# Patient Record
Sex: Female | Born: 1995 | Race: Black or African American | Hispanic: No
Health system: Southern US, Community
[De-identification: ages and names within clinical notes are randomized; demographics above are authoritative.]

## PROBLEM LIST (undated history)

## (undated) DIAGNOSIS — M67439 Ganglion, unspecified wrist: Secondary | ICD-10-CM

## (undated) DIAGNOSIS — Z9109 Other allergy status, other than to drugs and biological substances: Secondary | ICD-10-CM

---
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
## 2018-04-15 IMAGING — CT CT ABD-PELV W/ CM
2 of 4 series · 16 of 46 positions shown, 18 images · IV contrast (APPLIED)
Comparison: None.

CLINICAL DATA: Right lower quadrant pain with onset of symptoms
this morning.

EXAM:
CT ABDOMEN AND PELVIS WITH CONTRAST
TECHNIQUE: Multidetector CT imaging of the abdomen and pelvis was performed
using the standard protocol following bolus administration of
intravenous contrast.
CONTRAST:  100mL LISCGC-B11 IOPAMIDOL (LISCGC-B11) INJECTION 61%

[Series 2: axial st · axial · 0.83mm/px · z∈[-514,-78]mm · 13 of 95 slices shown, 15 images]
[im 4/95  soft-tissue]
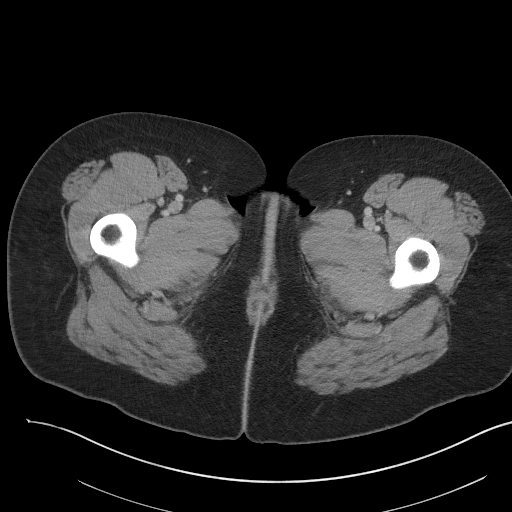
[im 4/95  bone]
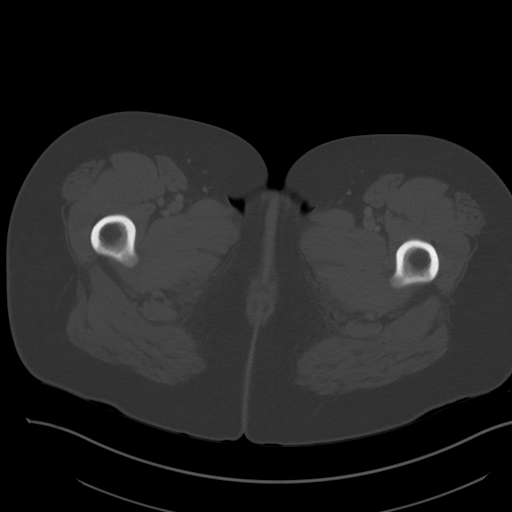
[im 12/95  soft-tissue]
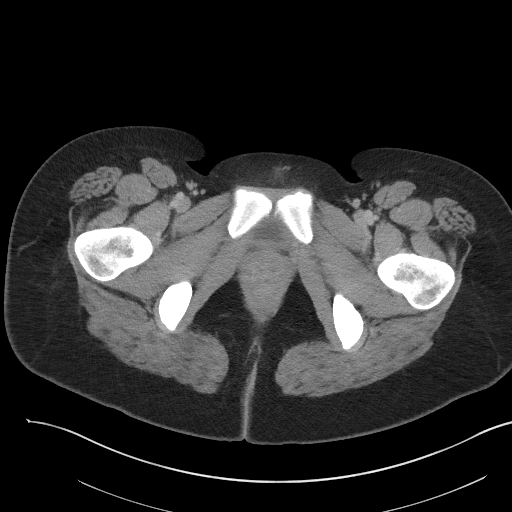
[im 19/95  soft-tissue]
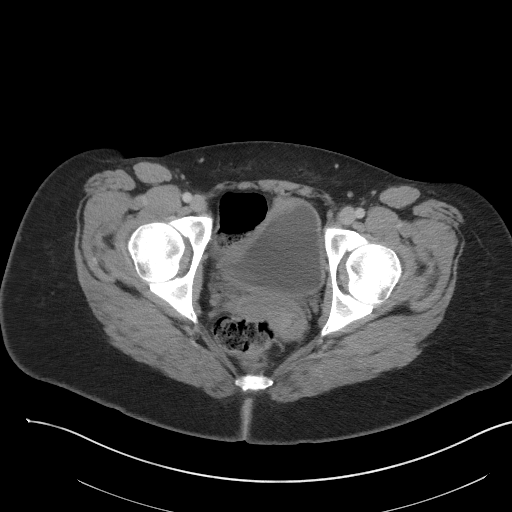
[im 27/95  soft-tissue]
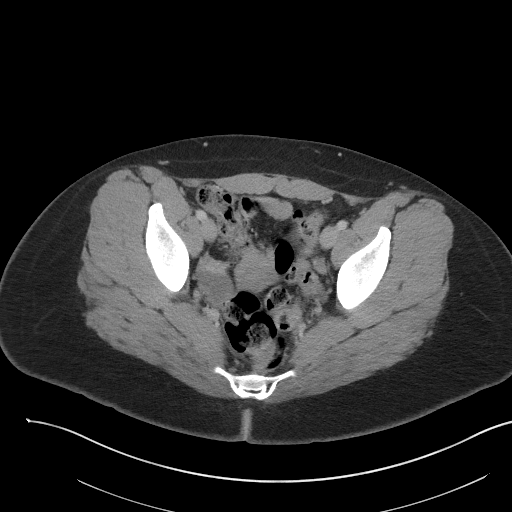
[im 34/95  soft-tissue]
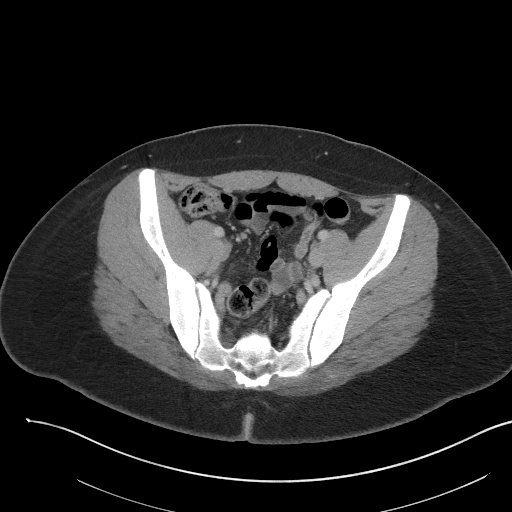
[im 42/95  soft-tissue]
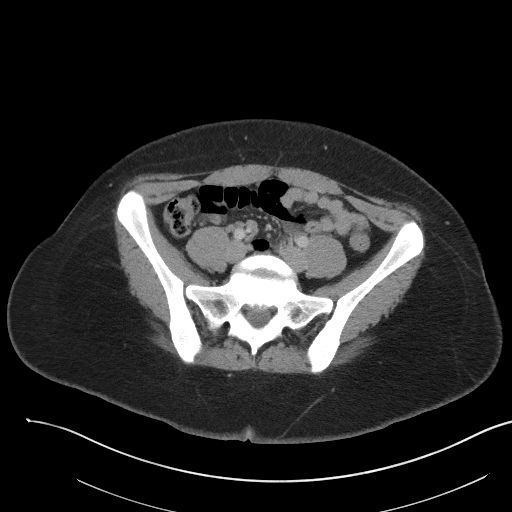
[im 49/95  soft-tissue]
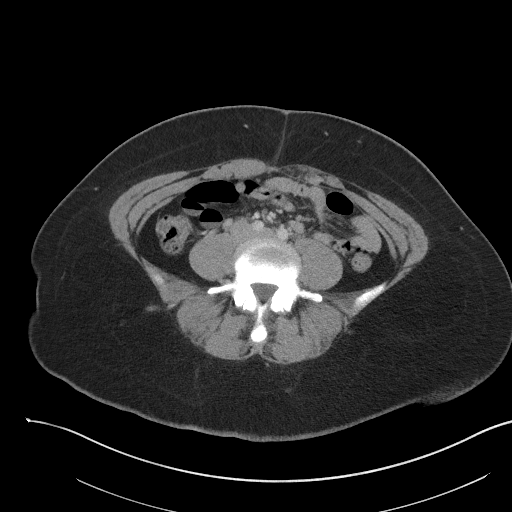
[im 53/95  soft-tissue]
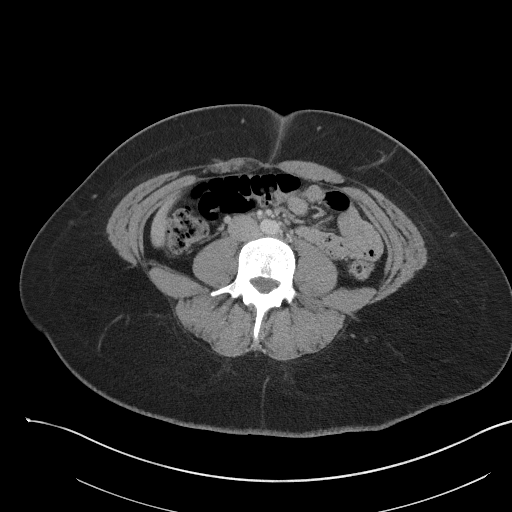
[im 61/95  soft-tissue]
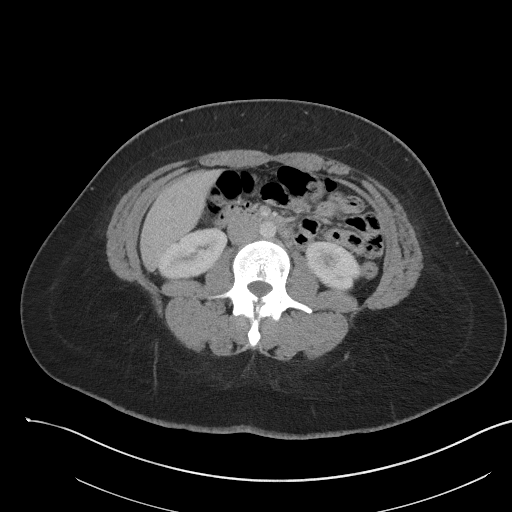
[im 61/95  bone]
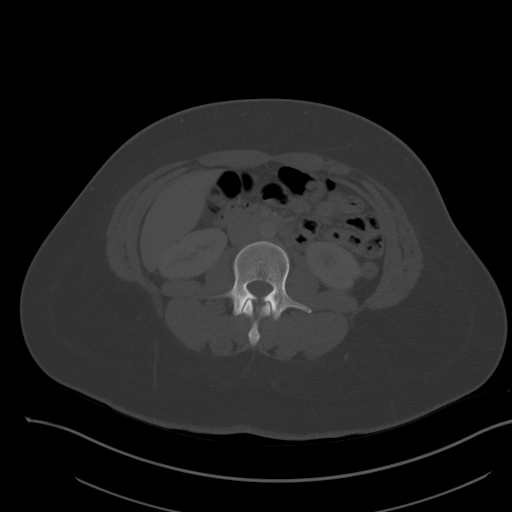
[im 68/95  soft-tissue]
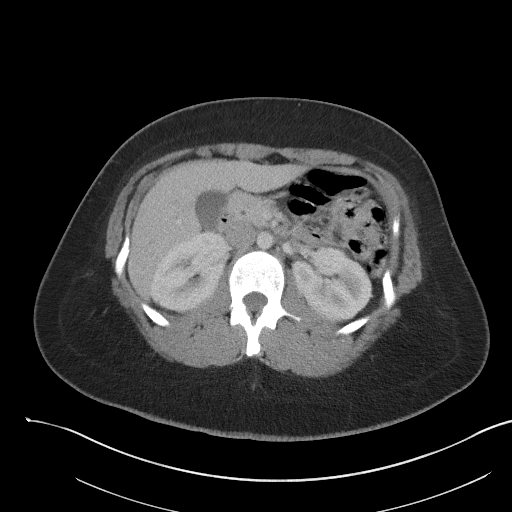
[im 76/95  soft-tissue]
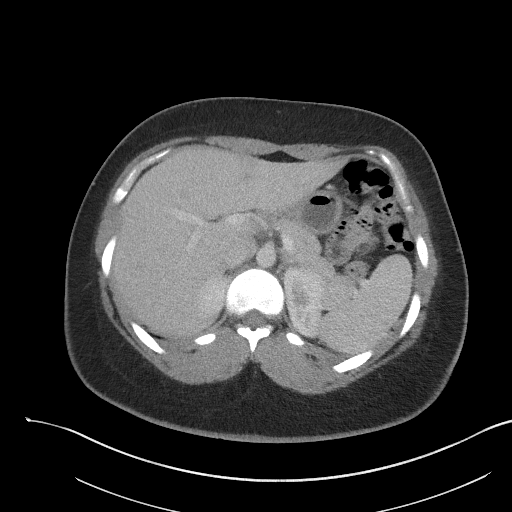
[im 83/95  soft-tissue]
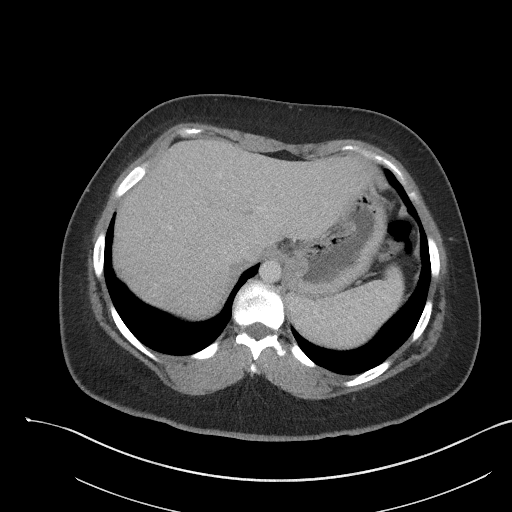
[im 91/95  soft-tissue]
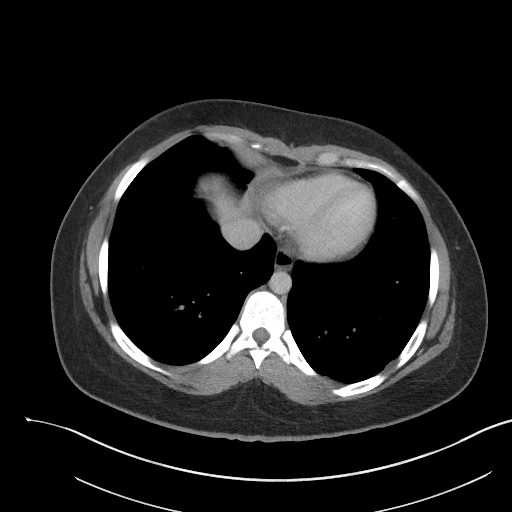

[Series 5: coronal st · coronal · 0.71mm/px · 3 of 89 slices shown]
[im 30/89  soft-tissue]
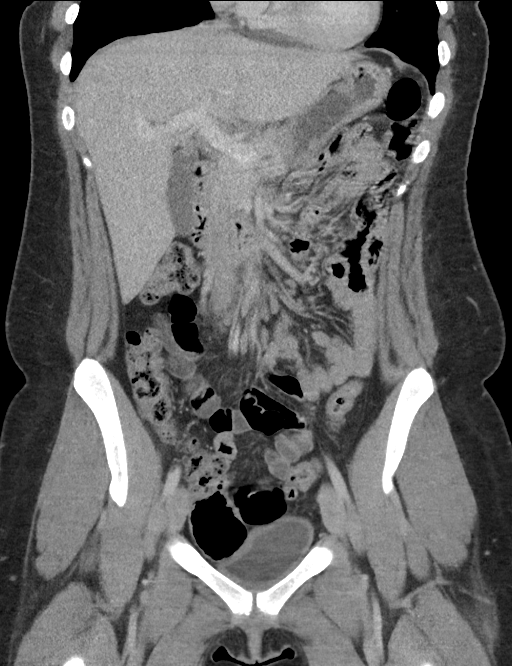
[im 40/89  soft-tissue]
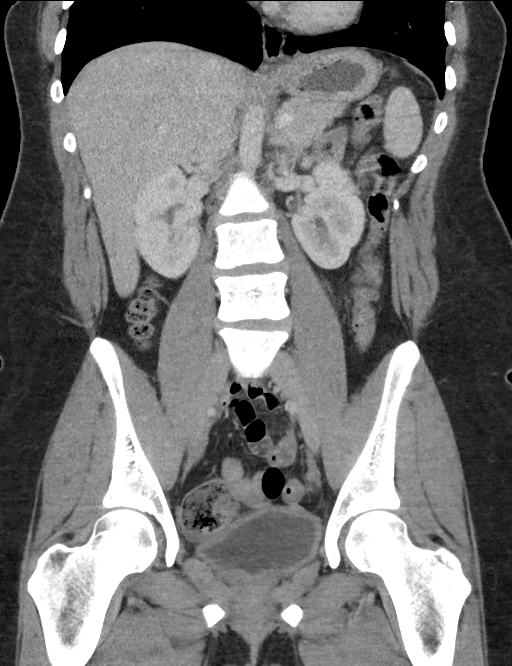
[im 49/89  soft-tissue]
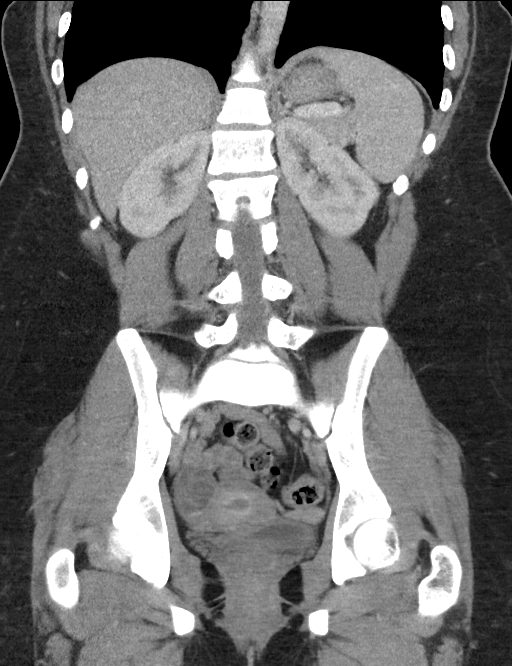

[16 of 46 positions shown; findings below may reference images not displayed]

FINDINGS: Lower chest: Negative.

Hepatobiliary: Liver and gallbladder are unremarkable. No biliary
ductal dilatation.

Pancreas: Negative.

Spleen: Negative.

Adrenals/Urinary Tract: Adrenal glands and kidneys are unremarkable.
Ureters are decompressed. Bladder is unremarkable.

Stomach/Bowel: Stomach, small bowel, appendix (coronal image 44) and
colon are unremarkable.

Vascular/Lymphatic: Vascular structures are unremarkable. No
pathologically enlarged lymph nodes.

Reproductive: Uterus is visualized.  No adnexal mass.

Other: No free fluid.  Mesenteries and peritoneum are unremarkable.

Musculoskeletal: No worrisome lytic or sclerotic lesions.
IMPRESSION: No findings to explain the patient's pain.  Normal exam.

## 2019-06-22 ENCOUNTER — Ambulatory Visit: Payer: Self-pay

## 2021-01-04 ENCOUNTER — Ambulatory Visit (HOSPITAL_COMMUNITY): Admission: EM | Admit: 2021-01-04 | Payer: Medicaid Other | Source: Home / Self Care

## 2021-01-04 ENCOUNTER — Other Ambulatory Visit: Payer: Self-pay

## 2021-07-12 ENCOUNTER — Ambulatory Visit
Admission: RE | Admit: 2021-07-12 | Discharge: 2021-07-12 | Disposition: A | Discharge status: Home / Self Care | Payer: Medicaid Other | Source: Ambulatory Visit | Attending: Internal Medicine | Admitting: Internal Medicine

## 2021-07-12 VITALS — BP 114/81 | HR 80 | Temp 98.3°F | Resp 18 | Ht 71.0 in | Wt 270.0 lb

## 2021-07-12 DIAGNOSIS — N910 Primary amenorrhea: Secondary | ICD-10-CM

## 2021-07-12 LAB — CBC WITH DIFFERENTIAL/PLATELET
Abs Immature Granulocytes: 0.05 10*3/uL (ref 0.00–0.07)
Basophils Absolute: 0.1 10*3/uL (ref 0.0–0.1)
Basophils Relative: 0 %
Eosinophils Absolute: 0.4 10*3/uL (ref 0.0–0.5)
Eosinophils Relative: 4 %
HCT: 37.5 % (ref 36.0–46.0)
Hemoglobin: 12 g/dL (ref 12.0–15.0)
Immature Granulocytes: 0 %
Lymphocytes Relative: 30 %
Lymphs Abs: 3.4 10*3/uL (ref 0.7–4.0)
MCH: 27.7 pg (ref 26.0–34.0)
MCHC: 32 g/dL (ref 30.0–36.0)
MCV: 86.6 fL (ref 80.0–100.0)
Monocytes Absolute: 1 10*3/uL (ref 0.1–1.0)
Monocytes Relative: 9 %
Neutro Abs: 6.4 10*3/uL (ref 1.7–7.7)
Neutrophils Relative %: 57 %
Platelets: 308 10*3/uL (ref 150–400)
RBC: 4.33 MIL/uL (ref 3.87–5.11)
RDW: 13.6 % (ref 11.5–15.5)
WBC: 11.3 10*3/uL — ABNORMAL HIGH (ref 4.0–10.5)
nRBC: 0 % (ref 0.0–0.2)

## 2021-07-12 LAB — COMPREHENSIVE METABOLIC PANEL
ALT: 20 U/L (ref 0–44)
AST: 23 U/L (ref 15–41)
Albumin: 3.6 g/dL (ref 3.5–5.0)
Alkaline Phosphatase: 65 U/L (ref 38–126)
Anion gap: 6 (ref 5–15)
BUN: 11 mg/dL (ref 6–20)
CO2: 25 mmol/L (ref 22–32)
Calcium: 8.7 mg/dL — ABNORMAL LOW (ref 8.9–10.3)
Chloride: 103 mmol/L (ref 98–111)
Creatinine, Ser: 0.72 mg/dL (ref 0.44–1.00)
GFR, Estimated: >60 mL/min (ref >60)
Glucose, Bld: 63 mg/dL — ABNORMAL LOW (ref 70–99)
Potassium: 3.7 mmol/L (ref 3.5–5.1)
Sodium: 134 mmol/L — ABNORMAL LOW (ref 135–145)
Total Bilirubin: 0.4 mg/dL (ref 0.3–1.2)
Total Protein: 7.7 g/dL (ref 6.5–8.1)

## 2021-07-12 LAB — URINALYSIS, ROUTINE W REFLEX MICROSCOPIC
Bilirubin Urine: NEGATIVE
Glucose, UA: NEGATIVE mg/dL
Hgb urine dipstick: NEGATIVE
Ketones, ur: NEGATIVE mg/dL
Leukocytes,Ua: NEGATIVE
Nitrite: NEGATIVE
Protein, ur: NEGATIVE mg/dL
Specific Gravity, Urine: 1.02 (ref 1.005–1.030)
pH: 6.5 (ref 5.0–8.0)

## 2021-07-12 LAB — PREGNANCY, URINE: Preg Test, Ur: NEGATIVE

## 2021-07-12 LAB — TSH: TSH: 2.191 u[IU]/mL (ref 0.350–4.500)

## 2021-07-12 NOTE — ED Notes (Signed)
Provided work note

## 2021-07-12 NOTE — ED Triage Notes (Signed)
Patient is here for "Abd Pain". "Missing menses, [id number] was last, family history of cervical problems, benign tumors, cysts on ovaries, Gyn has no bookings for mos". Recently started "allergy medication'.  ?

## 2021-07-12 NOTE — Discharge Instructions (Addendum)
Establish follow-up with primary care physician for further management. ?We will call you with treatment if labs abnormal ?

## 2021-07-14 NOTE — ED Provider Notes (Signed)
?[facility] ? ? ? ?CSN: [id number] ?Arrival date & time: 07/12/21  1357 ? ? ?  ? ?History   ?Chief Complaint ?Chief Complaint  ?Patient presents with  ? Abdominal Pain  ?  Obgyn ? ?Last period was 1 month & 15 days ago. ?Need to be checked, I have a family history of cervical problems. - Entered by patient ?Appt.  ? ? ?HPI ?Kennede Prosen is a 26 y.o. female comes to urgent care with complaints of abdominal pain.  She denies any morning sickness, persistent nausea or vomiting.  No bloating.  No abdominal distention.  No fever or chills.  No dysuria urgency or frequency.  She is also complaining of having some fatigue.  No dizziness, near syncope or syncopal episodes.  Last menstrual period was 05/28/2021.  No cold intolerance.  No significant weight changes recently. ?HPI ? ?Past Medical History:  ?Diagnosis Date  ? Environmental allergies   ? Ganglion cyst of wrist 11/2012  ? left  ? ? ?There are no problems to display for this patient. ? ? ?Past Surgical History:  ?Procedure Laterality Date  ? GANGLION CYST EXCISION Left 12/11/2012  ? Procedure: REMOVAL GANGLION OF LEFT WRIST;  Surgeon: Jerilynn Mages. Gerald Stabs, MD;  Location: [facility];  Service: Pediatrics;  Laterality: Left;  ? ? ?OB History   ?No obstetric history on file. ?  ? ? ? ?Home Medications   ? ?Prior to Admission medications   ?Medication Sig Start Date End Date Taking? Authorizing Provider  ?albuterol (VENTOLIN HFA) 108 (90 Base) MCG/ACT inhaler INHALE 2 PUFFS BY MOUTH EVERY 4 HOURS AS NEEDED FOR COUGH OR WHEEZE 05/27/15  Yes [provider]  ?cetirizine (ZYRTEC) 10 MG tablet Take by mouth. 05/26/15  Yes [provider]  ?chlorpheniramine-HYDROcodone 10-8 MG/5ML Take by mouth. 01/04/21  Yes [provider]  ?doxycycline (VIBRAMYCIN) 100 MG capsule Take by mouth. 07/08/21 07/15/21 Yes [provider]  ?meloxicam (MOBIC) 15 MG tablet Take by mouth. 01/04/21 01/04/22 Yes [provider]   ?chlorhexidine (PERIDEX) 0.12 % solution SMARTSIG:By Mouth 05/22/21   [provider]  ?HYDROcodone-acetaminophen (NORCO/VICODIN) 5-325 MG per tablet Take 1-2 tablets by mouth every 6 (six) hours as needed for pain. 12/11/12   Gerald Stabs, MD  ?levocetirizine (XYZAL) 5 MG tablet Take 5 mg by mouth every evening.    [provider]  ?montelukast (SINGULAIR) 10 MG tablet SMARTSIG:1 Tablet(s) By Mouth Every Evening 06/21/21   [provider]  ?ondansetron (ZOFRAN ODT) 4 MG disintegrating tablet Take 1 tablet (4 mg total) by mouth every 8 (eight) hours as needed for nausea or vomiting. 07/04/16   Darel Hong, MD  ?sulfamethoxazole-trimethoprim (BACTRIM DS,SEPTRA DS) 800-160 MG tablet Take 1 tablet by mouth 2 (two) times daily. 06/30/15   Sable Feil, PA-C  ? ? ?Family History ?Family History  ?Problem Relation Age of Onset  ? Hypertension Maternal Grandmother   ? Hypertension Maternal Grandfather   ? Diabetes Paternal Grandmother   ? ? ?Social History ?Social History  ? ?Tobacco Use  ? Smoking status: Never  ? Smokeless tobacco: Never  ?Vaping Use  ? Vaping Use: Never used  ?Substance Use Topics  ? Alcohol use: No  ? Drug use: No  ? ? ? ?Allergies   ?Almond (diagnostic), Almond oil, and Amoxicillin ? ? ?Review of Systems ?Review of Systems ?As per HPI ? ?Physical Exam ?Triage Vital Signs ?ED Triage Vitals  ?Enc Vitals Group  ?   BP 07/12/21  1418 114/81  ?   Pulse Rate 07/12/21 1418 80  ?   Resp 07/12/21 1418 18  ?   Temp 07/12/21 1418 98.3 ?F (36.8 ?C)  ?   Temp Source 07/12/21 1418 Oral  ?   SpO2 07/12/21 1418 99 %  ?   Weight 07/12/21 1415 270 lb (122.5 kg)  ?   Height 07/12/21 1415 5\' 11"  (1.803 m)  ?   Head Circumference --   ?   Peak Flow --   ?   Pain Score 07/12/21 1411 2  ?   Pain Loc --   ?   Pain Edu? --   ?   Excl. in [place]? --   ? ?No data found. ? ?Updated Vital Signs ?BP 114/81 (BP Location: Left Arm)   Pulse 80   Temp 98.3 ?F (36.8 ?C) (Oral)   Resp 18   Ht 5\' 11"  (1.803  m)   Wt 122.5 kg   LMP 05/28/2021 (Exact Date)   SpO2 99%   BMI 37.66 kg/m?  ? ?Visual Acuity ?Right Eye Distance:   ?Left Eye Distance:   ?Bilateral Distance:   ? ?Right Eye Near:   ?Left Eye Near:    ?Bilateral Near:    ? ?Physical Exam ?Vitals and nursing note reviewed.  ?Constitutional:   ?   General: She is not in acute distress. ?   Appearance: She is not ill-appearing.  ?Cardiovascular:  ?   Rate and Rhythm: Normal rate and regular rhythm.  ?Pulmonary:  ?   Effort: Pulmonary effort is normal.  ?   Breath sounds: Normal breath sounds.  ?Abdominal:  ?   General: Bowel sounds are normal.  ?   Palpations: Abdomen is soft. There is no shifting dullness, hepatomegaly or splenomegaly.  ?   Tenderness: There is no abdominal tenderness.  ?Neurological:  ?   Mental Status: She is alert.  ? ? ? ?UC Treatments / Results  ?Labs ?(all labs ordered are listed, but only abnormal results are displayed) ?Labs Reviewed  ?CBC WITH DIFFERENTIAL/PLATELET - Abnormal; Notable for the following components:  ?    Result Value  ? WBC 11.3 (*)   ? All other components within normal limits  ?COMPREHENSIVE METABOLIC PANEL - Abnormal; Notable for the following components:  ? Sodium 134 (*)   ? Glucose, Bld 63 (*)   ? Calcium 8.7 (*)   ? All other components within normal limits  ?PREGNANCY, URINE  ?URINALYSIS, ROUTINE W REFLEX MICROSCOPIC  ?TSH  ? ? ?EKG ? ? ?Radiology ?No results found. ? ?Procedures ?Procedures (including critical care time) ? ?Medications Ordered in UC ?Medications - No data to display ? ?Initial Impression / Assessment and Plan / UC Course  ?I have reviewed the triage vital signs and the nursing notes. ? ?Pertinent labs & imaging results that were available during my care of the patient were reviewed by me and considered in my medical decision making (see chart for details). ? ?  ? ?1.  Delayed menses: ?TSH, ?Urine pregnancy test is negative ?CBC, CMP ?We will call patient with recommendations if labs are  abnormal ?Return to urgent care for any other concerns. ?Final Clinical Impressions(s) / UC Diagnoses  ? ?Final diagnoses:  ?Delayed menses  ? ? ? ?Discharge Instructions   ? ?  ?Establish follow-up with primary care physician for further management. ?We will call you with treatment if labs abnormal ? ? ? ?ED Prescriptions   ?None ?  ? ?PDMP not  reviewed this encounter. ?  ?Chase Picket, MD ?07/14/21 1845 ? ?

## 2021-07-18 ENCOUNTER — Ambulatory Visit
Admission: RE | Admit: 2021-07-18 | Discharge: 2021-07-18 | Disposition: A | Discharge status: Home / Self Care | Payer: Medicaid Other | Source: Ambulatory Visit

## 2021-07-18 VITALS — BP 120/79 | HR 93 | Temp 97.9°F | Resp 18

## 2021-07-18 DIAGNOSIS — M79621 Pain in right upper arm: Secondary | ICD-10-CM

## 2021-07-18 DIAGNOSIS — R52 Pain, unspecified: Secondary | ICD-10-CM

## 2021-07-18 NOTE — ED Provider Notes (Signed)
?[facility] ? ? ? ?CSN: [id number] ?Arrival date & time: 07/18/21  1727 ? ? ?  ? ?History   ?Chief Complaint ?Chief Complaint  ?Patient presents with  ? Generalized Body Aches  ? Arm Pain  ? ? ?HPI ?Kristin Fuller is a 26 y.o. female.  Patient presents with a painful knot in her right upper arm yesterday which has resolved today.  She also has generalized body aches x3 days.  She denies numbness, weakness, fever, chills, chest pain, shortness of breath, or other symptoms.  When patient had the knot in her arm yesterday, she did not see any redness or wounds; she did not have any increased warmth.  No injuries.  She was seen by her gynecologist on 07/13/2021 and started on medroxyprogesterone.  She is concerned that this medication may be causing her symptoms and that she may be having a reaction to the medication.   ?The history is provided by the patient and medical records.  ? ?Past Medical History:  ?Diagnosis Date  ? Environmental allergies   ? Ganglion cyst of wrist 11/2012  ? left  ? ? ?There are no problems to display for this patient. ? ? ?Past Surgical History:  ?Procedure Laterality Date  ? GANGLION CYST EXCISION Left 12/11/2012  ? Procedure: REMOVAL GANGLION OF LEFT WRIST;  Surgeon: Judie PetitM. Leonia CoronaShuaib Farooqui, MD;  Location: [facility];  Service: Pediatrics;  Laterality: Left;  ? ? ?OB History   ?No obstetric history on file. ?  ? ? ? ?Home Medications   ? ?Prior to Admission medications   ?Medication Sig Start Date End Date Taking? Authorizing Provider  ?albuterol (VENTOLIN HFA) 108 (90 Base) MCG/ACT inhaler INHALE 2 PUFFS BY MOUTH EVERY 4 HOURS AS NEEDED FOR COUGH OR WHEEZE 05/27/15   [provider]  ?cetirizine (ZYRTEC) 10 MG tablet Take by mouth. 05/26/15   [provider]  ?chlorhexidine (PERIDEX) 0.12 % solution SMARTSIG:By Mouth 05/22/21   [provider]  ?chlorpheniramine-HYDROcodone 10-8 MG/5ML Take by mouth. 01/04/21   [provider]   ?HYDROcodone-acetaminophen (NORCO/VICODIN) 5-325 MG per tablet Take 1-2 tablets by mouth every 6 (six) hours as needed for pain. 12/11/12   Leonia CoronaFarooqui, Shuaib, MD  ?levocetirizine (XYZAL) 5 MG tablet Take 5 mg by mouth every evening.    [provider]  ?meloxicam (MOBIC) 15 MG tablet Take by mouth. 01/04/21 01/04/22  [provider]  ?montelukast (SINGULAIR) 10 MG tablet SMARTSIG:1 Tablet(s) By Mouth Every Evening 06/21/21   [provider]  ?ondansetron (ZOFRAN ODT) 4 MG disintegrating tablet Take 1 tablet (4 mg total) by mouth every 8 (eight) hours as needed for nausea or vomiting. 07/04/16   Merrily Brittleifenbark, Neil, MD  ?sulfamethoxazole-trimethoprim (BACTRIM DS,SEPTRA DS) 800-160 MG tablet Take 1 tablet by mouth 2 (two) times daily. 06/30/15   Joni ReiningSmith, Ronald K, PA-C  ? ? ?Family History ?Family History  ?Problem Relation Age of Onset  ? Hypertension Maternal Grandmother   ? Hypertension Maternal Grandfather   ? Diabetes Paternal Grandmother   ? ? ?Social History ?Social History  ? ?Tobacco Use  ? Smoking status: Never  ? Smokeless tobacco: Never  ?Vaping Use  ? Vaping Use: Never used  ?Substance Use Topics  ? Alcohol use: No  ? Drug use: No  ? ? ? ?Allergies   ?Almond (diagnostic), Almond oil, and Amoxicillin ? ? ?Review of Systems ?Review of Systems  ?Constitutional:  Negative for chills and fever.  ?Respiratory:  Negative for cough and shortness  of breath.   ?Cardiovascular:  Negative for chest pain and palpitations.  ?Musculoskeletal:  Positive for myalgias. Negative for arthralgias.  ?Skin:  Negative for color change, rash and wound.  ?Neurological:  Negative for weakness and numbness.  ?All other systems reviewed and are negative. ? ? ?Physical Exam ?Triage Vital Signs ?ED Triage Vitals  ?Enc Vitals Group  ?   BP 07/18/21 1745 120/79  ?   Pulse Rate 07/18/21 1745 93  ?   Resp 07/18/21 1745 18  ?   Temp 07/18/21 1745 97.9 ?F (36.6 ?C)  ?   Temp src --   ?   SpO2 07/18/21 1745 97 %  ?   Weight --    ?   Height --   ?   Head Circumference --   ?   Peak Flow --   ?   Pain Score 07/18/21 1759 7  ?   Pain Loc --   ?   Pain Edu? --   ?   Excl. in [place]? --   ? ?No data found. ? ?Updated Vital Signs ?BP 120/79   Pulse 93   Temp 97.9 ?F (36.6 ?C)   Resp 18   SpO2 97%  ? ?Visual Acuity ?Right Eye Distance:   ?Left Eye Distance:   ?Bilateral Distance:   ? ?Right Eye Near:   ?Left Eye Near:    ?Bilateral Near:    ? ?Physical Exam ?Vitals and nursing note reviewed.  ?Constitutional:   ?   General: She is not in acute distress. ?   Appearance: She is well-developed. She is obese. She is not ill-appearing.  ?HENT:  ?   Mouth/Throat:  ?   Mouth: Mucous membranes are moist.  ?Cardiovascular:  ?   Rate and Rhythm: Normal rate and regular rhythm.  ?   Heart sounds: Normal heart sounds. No murmur heard. ?Pulmonary:  ?   Effort: Pulmonary effort is normal. No respiratory distress.  ?   Breath sounds: Normal breath sounds.  ?Musculoskeletal:     ?   General: Tenderness present. No swelling or deformity. Normal range of motion.  ?   Cervical back: Neck supple.  ?   Comments: Mild tenderness in posterior right upper arm.  No mass palpated.  No redness, ecchymosis, wounds.  ?Skin: ?   General: Skin is warm and dry.  ?   Capillary Refill: Capillary refill takes less than 2 seconds.  ?   Findings: No bruising, erythema, lesion or rash.  ?Neurological:  ?   General: No focal deficit present.  ?   Mental Status: She is alert and oriented to person, place, and time.  ?   Sensory: No sensory deficit.  ?   Motor: No weakness.  ?Psychiatric:     ?   Mood and Affect: Mood normal.     ?   Behavior: Behavior normal.  ? ? ? ?UC Treatments / Results  ?Labs ?(all labs ordered are listed, but only abnormal results are displayed) ?Labs Reviewed - No data to display ? ?EKG ? ? ?Radiology ?No results found. ? ?Procedures ?Procedures (including critical care time) ? ?Medications Ordered in UC ?Medications - No data to display ? ?Initial Impression /  Assessment and Plan / UC Course  ?I have reviewed the triage vital signs and the nursing notes. ? ?Pertinent labs & imaging results that were available during my care of the patient were reviewed by me and considered in my medical decision making (see chart for  details). ? ?  ?Right upper arm pain, body aches.  Patient is well-appearing and her exam is reassuring.  She is afebrile and vital signs are stable.  No mass palpated in her upper arm.  No indication of infection or injury.  Instructed her to take Tylenol or ibuprofen as needed for discomfort.  ED precautions discussed.  Work note provided per patient request.  Instructed her to follow-up with her gynecologist as scheduled.  She agrees to plan of care. ? ?Final Clinical Impressions(s) / UC Diagnoses  ? ?Final diagnoses:  ?Pain in right upper arm  ?Body aches  ? ? ? ?Discharge Instructions   ? ?  ?Take Tylenol or ibuprofen as needed for discomfort.  Go to the emergency department if you have a painful knot in your upper arm again. ? ? ? ? ?ED Prescriptions   ?None ?  ? ?PDMP not reviewed this encounter. ?  ?Mickie Bail, NP ?07/18/21 1847 ? ?

## 2021-07-18 NOTE — ED Triage Notes (Signed)
Pt here with right arm "knot" and generalized body aches x 3 days since starting a new hormones. Pt would like to make sure she is not experiencing a reaction. She called OBGYN and they stated it is not likely due to the meds.  ?

## 2021-07-18 NOTE — Discharge Instructions (Addendum)
Take Tylenol or ibuprofen as needed for discomfort.  Go to the emergency department if you have a painful knot in your upper arm again. ?

## 2021-07-25 ENCOUNTER — Other Ambulatory Visit: Payer: Self-pay

## 2021-07-25 DIAGNOSIS — Z021 Encounter for pre-employment examination: Secondary | ICD-10-CM

## 2021-07-25 NOTE — Progress Notes (Signed)
Presents to [facility] for pre-employment drug screen. ? ?[facility] Acct #:  [id number] ?[facility] Specimen #:  [id number] ? ?Rapid Drug Screen Results = Negative. ? ?AMD ?

## 2021-09-11 ENCOUNTER — Emergency Department
Admission: EM | Admit: 2021-09-11 | Discharge: 2021-09-11 | Disposition: A | Discharge status: Home / Self Care | Payer: Medicaid Other | Attending: Emergency Medicine | Admitting: Emergency Medicine

## 2021-09-11 ENCOUNTER — Emergency Department: Payer: Medicaid Other

## 2021-09-11 ENCOUNTER — Encounter: Payer: Self-pay | Admitting: Emergency Medicine

## 2021-09-11 DIAGNOSIS — R110 Nausea: Secondary | ICD-10-CM | POA: Insufficient documentation

## 2021-09-11 DIAGNOSIS — R1013 Epigastric pain: Secondary | ICD-10-CM | POA: Diagnosis not present

## 2021-09-11 DIAGNOSIS — R1011 Right upper quadrant pain: Secondary | ICD-10-CM | POA: Diagnosis present

## 2021-09-11 LAB — COMPREHENSIVE METABOLIC PANEL
ALT: 14 U/L (ref 0–44)
AST: 16 U/L (ref 15–41)
Albumin: 3.6 g/dL (ref 3.5–5.0)
Alkaline Phosphatase: 54 U/L (ref 38–126)
Anion gap: 6 (ref 5–15)
BUN: 12 mg/dL (ref 6–20)
CO2: 24 mmol/L (ref 22–32)
Calcium: 8.8 mg/dL — ABNORMAL LOW (ref 8.9–10.3)
Chloride: 106 mmol/L (ref 98–111)
Creatinine, Ser: 0.58 mg/dL (ref 0.44–1.00)
GFR, Estimated: >60 mL/min (ref >60)
Glucose, Bld: 88 mg/dL (ref 70–99)
Potassium: 4 mmol/L (ref 3.5–5.1)
Sodium: 136 mmol/L (ref 135–145)
Total Bilirubin: 0.5 mg/dL (ref 0.3–1.2)
Total Protein: 7.9 g/dL (ref 6.5–8.1)

## 2021-09-11 LAB — LIPASE, BLOOD: Lipase: 24 U/L (ref 11–51)

## 2021-09-11 LAB — CBC
HCT: 41 % (ref 36.0–46.0)
Hemoglobin: 12.9 g/dL (ref 12.0–15.0)
MCH: 27.4 pg (ref 26.0–34.0)
MCHC: 31.5 g/dL (ref 30.0–36.0)
MCV: 87.2 fL (ref 80.0–100.0)
Platelets: 325 10*3/uL (ref 150–400)
RBC: 4.7 MIL/uL (ref 3.87–5.11)
RDW: 12.8 % (ref 11.5–15.5)
WBC: 11.3 10*3/uL — ABNORMAL HIGH (ref 4.0–10.5)
nRBC: 0 % (ref 0.0–0.2)

## 2021-09-11 LAB — URINALYSIS, ROUTINE W REFLEX MICROSCOPIC
Bilirubin Urine: NEGATIVE
Glucose, UA: NEGATIVE mg/dL
Hgb urine dipstick: NEGATIVE
Ketones, ur: NEGATIVE mg/dL
Leukocytes,Ua: NEGATIVE
Nitrite: NEGATIVE
Protein, ur: NEGATIVE mg/dL
Specific Gravity, Urine: 1.017 (ref 1.005–1.030)
pH: 6 (ref 5.0–8.0)

## 2021-09-11 LAB — POC URINE PREG, ED: Preg Test, Ur: NEGATIVE

## 2021-09-11 MED ORDER — PANTOPRAZOLE SODIUM 40 MG PO TBEC: 40.0000 mg | DELAYED_RELEASE_TABLET | Freq: Every day | ORAL | Status: DC

## 2021-09-11 MED ORDER — ONDANSETRON 4 MG PO TBDP
4.0000 mg | ORAL_TABLET | Freq: Once | ORAL | Status: AC | PRN
  Administered 2021-09-11: 4 mg via ORAL
  Filled 2021-09-11: qty 1

## 2021-09-11 MED ORDER — DICYCLOMINE HCL 10 MG PO CAPS: 10.0000 mg | ORAL_CAPSULE | Freq: Three times a day (TID) | ORAL | 0 refills | Status: AC | PRN

## 2021-09-11 MED ORDER — ALUM & MAG HYDROXIDE-SIMETH 200-200-20 MG/5ML PO SUSP
30.0000 mL | Freq: Once | ORAL | Status: AC
  Administered 2021-09-11: 30 mL via ORAL
  Filled 2021-09-11: qty 30

## 2021-09-11 MED ORDER — DICYCLOMINE HCL 10 MG PO CAPS
10.0000 mg | ORAL_CAPSULE | Freq: Once | ORAL | Status: AC
  Administered 2021-09-11: 10 mg via ORAL
  Filled 2021-09-11: qty 1

## 2021-09-11 MED ORDER — LIDOCAINE VISCOUS HCL 2 % MT SOLN: 15.0000 mL | Freq: Three times a day (TID) | OROMUCOSAL | 1 refills | Status: AC | PRN

## 2021-09-11 MED ORDER — OMEPRAZOLE 10 MG PO CPDR: 10.0000 mg | DELAYED_RELEASE_CAPSULE | Freq: Every day | ORAL | 0 refills | Status: AC

## 2021-09-11 MED ORDER — LIDOCAINE VISCOUS HCL 2 % MT SOLN
15.0000 mL | Freq: Once | OROMUCOSAL | Status: AC
  Administered 2021-09-11: 15 mL via ORAL
  Filled 2021-09-11: qty 15

## 2021-09-11 NOTE — ED Triage Notes (Signed)
Pt presents via POV with complaints of abdominal pain with associated nausea. She notes having a previous sx (2017 - cyst removal) and was started on an antibiotics last week and was able to tolerate the meds for the first 2-3 days. She has been taking OTC Tylenol.

## 2022-02-28 ENCOUNTER — Ambulatory Visit
Admission: EM | Admit: 2022-02-28 | Discharge: 2022-02-28 | Disposition: A | Discharge status: Home / Self Care | Payer: Medicaid Other

## 2022-02-28 DIAGNOSIS — M25572 Pain in left ankle and joints of left foot: Secondary | ICD-10-CM | POA: Diagnosis not present

## 2022-02-28 NOTE — ED Provider Notes (Signed)
Roderic Palau    CSN: [id number] Arrival date & time: 02/28/22  1556      History   Chief Complaint Chief Complaint  Patient presents with   Ankle Pain    Twisted ankle - Entered by patient    HPI Maritsa Krenzer is a 26 y.o. female.    Ankle Pain   Patient presents to urgent care with complaint of left ankle pain following an injury which occurred this morning.  Patient states she slipped on her deck which was icy in the cold and her foot inverted.  Patient endorses similar injury and multiple reinjuries over the last 2 years.  She was evaluated at [place] [facility] at the time of the evaluation, she was referred to physical therapy and states she "did her physical therapy at home".  Past Medical History:  Diagnosis Date   Environmental allergies    Ganglion cyst of wrist 11/2012   left    There are no problems to display for this patient.   Past Surgical History:  Procedure Laterality Date   GANGLION CYST EXCISION Left 12/11/2012   Procedure: REMOVAL GANGLION OF LEFT WRIST;  Surgeon: Jerilynn Mages. Gerald Stabs, MD;  Location: [place];  Service: Pediatrics;  Laterality: Left;    OB History   No obstetric history on file.      Home Medications    Prior to Admission medications   Medication Sig Start Date End Date Taking? Authorizing Provider  albuterol (VENTOLIN HFA) 108 (90 Base) MCG/ACT inhaler INHALE 2 PUFFS BY MOUTH EVERY 4 HOURS AS NEEDED FOR COUGH OR WHEEZE 05/27/15   [provider]  cetirizine (ZYRTEC) 10 MG tablet Take by mouth. 05/26/15   [provider]  chlorhexidine (PERIDEX) 0.12 % solution SMARTSIG:By Mouth 05/22/21   [provider]  chlorpheniramine-HYDROcodone 10-8 MG/5ML Take by mouth. 01/04/21   [provider]  dicyclomine (BENTYL) 10 MG capsule Take 1 capsule (10 mg total) by mouth 3 (three) times daily as needed (Abdominal pain). 09/11/21   Cuthriell, Charline Bills, PA-C  GI  Cocktail (alum & mag hydroxide-simethicone/lidocaine)oral mixture Take 15 mLs by mouth 3 (three) times daily as needed (Epigastric pain). 09/11/21   Cuthriell, Charline Bills, PA-C  HYDROcodone-acetaminophen (NORCO/VICODIN) 5-325 MG per tablet Take 1-2 tablets by mouth every 6 (six) hours as needed for pain. 12/11/12   Gerald Stabs, MD  levocetirizine (XYZAL) 5 MG tablet Take 5 mg by mouth every evening.    [provider]  montelukast (SINGULAIR) 10 MG tablet SMARTSIG:1 Tablet(s) By Mouth Every Evening 06/21/21   [provider]  omeprazole (PRILOSEC) 10 MG capsule Take 1 capsule (10 mg total) by mouth daily. 09/11/21   Cuthriell, Charline Bills, PA-C  ondansetron (ZOFRAN ODT) 4 MG disintegrating tablet Take 1 tablet (4 mg total) by mouth every 8 (eight) hours as needed for nausea or vomiting. 07/04/16   Darel Hong, MD  sulfamethoxazole-trimethoprim (BACTRIM DS,SEPTRA DS) 800-160 MG tablet Take 1 tablet by mouth 2 (two) times daily. 06/30/15   Sable Feil, PA-C    Family History Family History  Problem Relation Age of Onset   Hypertension Maternal Grandmother    Hypertension Maternal Grandfather    Diabetes Paternal Grandmother     Social History Social History   Tobacco Use   Smoking status: Never   Smokeless tobacco: Never  Vaping Use   Vaping Use: Never used  Substance Use Topics   Alcohol use: No   Drug use: No  Allergies   Almond (diagnostic), Almond oil, and Amoxicillin   Review of Systems Review of Systems   Physical Exam Triage Vital Signs ED Triage Vitals  Enc Vitals Group     BP      Pulse      Resp      Temp      Temp src      SpO2      Weight      Height      Head Circumference      Peak Flow      Pain Score      Pain Loc      Pain Edu?      Excl. in [place]?    No data found.  Updated Vital Signs There were no vitals taken for this visit.  Visual Acuity Right Eye Distance:   Left Eye Distance:   Bilateral Distance:     Right Eye Near:   Left Eye Near:    Bilateral Near:     Physical Exam Vitals reviewed.  Constitutional:      Appearance: Normal appearance.  Musculoskeletal:       Feet:  Skin:    General: Skin is warm and dry.  Neurological:     General: No focal deficit present.     Mental Status: She is alert and oriented to person, place, and time.  Psychiatric:        Mood and Affect: Mood normal.        Behavior: Behavior normal.      UC Treatments / Results  Labs (all labs ordered are listed, but only abnormal results are displayed) Labs Reviewed - No data to display  EKG   Radiology No results found.  Procedures Procedures (including critical care time)  Medications Ordered in UC Medications - No data to display  Initial Impression / Assessment and Plan / UC Course  I have reviewed the triage vital signs and the nursing notes.  Pertinent labs & imaging results that were available during my care of the patient were reviewed by me and considered in my medical decision making (see chart for details).  Likely soft tissue injury, possible sprain.  Patient is already in lace up ankle brace.  Recommending return to orthopedics for evaluation and possible imaging.   Final Clinical Impressions(s) / UC Diagnoses   Final diagnoses:  None   Discharge Instructions   None    ED Prescriptions   None    PDMP not reviewed this encounter.   Charma Igo, [place] 02/28/22 1656

## 2022-02-28 NOTE — ED Triage Notes (Signed)
Pt. Presents to UC w/ c/o left ankle pain that started after twisting her ankle today. Pt. States she had a major injury to this area two years ago.

## 2022-02-28 NOTE — Discharge Instructions (Addendum)
Recommend return to orthopedic for evaluation of acute on chronic ankle pain.

## 2022-03-12 ENCOUNTER — Emergency Department
Admission: EM | Admit: 2022-03-12 | Discharge: 2022-03-12 | Disposition: A | Discharge status: Home / Self Care | Payer: Medicaid Other | Attending: Student in an Organized Health Care Education/Training Program | Admitting: Student in an Organized Health Care Education/Training Program

## 2022-03-12 ENCOUNTER — Emergency Department: Payer: Medicaid Other

## 2022-03-12 ENCOUNTER — Other Ambulatory Visit: Payer: Self-pay

## 2022-03-12 DIAGNOSIS — X501XXA Overexertion from prolonged static or awkward postures, initial encounter: Secondary | ICD-10-CM | POA: Diagnosis not present

## 2022-03-12 DIAGNOSIS — S93401A Sprain of unspecified ligament of right ankle, initial encounter: Secondary | ICD-10-CM | POA: Diagnosis not present

## 2022-03-12 DIAGNOSIS — M25561 Pain in right knee: Secondary | ICD-10-CM | POA: Diagnosis present

## 2022-03-12 DIAGNOSIS — S8261XA Displaced fracture of lateral malleolus of right fibula, initial encounter for closed fracture: Secondary | ICD-10-CM | POA: Diagnosis not present

## 2022-03-12 MED ORDER — OXYCODONE-ACETAMINOPHEN 5-325 MG PO TABS
1.0000 | ORAL_TABLET | Freq: Once | ORAL | Status: AC
  Administered 2022-03-12: 1 via ORAL
  Filled 2022-03-12: qty 1

## 2022-03-12 MED ORDER — OXYCODONE-ACETAMINOPHEN 5-325 MG PO TABS: 1.0000 | ORAL_TABLET | ORAL | 0 refills | Status: AC | PRN

## 2022-03-12 NOTE — ED Provider Notes (Signed)
[facility] Provider Note    Event Date/Time   First MD Initiated Contact with Patient 03/12/22 1427     (approximate)   History   Ankle Injury   HPI  Kristin Fuller is a 26 y.o. female to the ER for evaluation of acute right ankle right knee pain that occurred today around lunch when she slipped on wet floor.  Did roll her ankle felt a popping sensation.  Unable to ambulate since then due to pain.  Does have small abrasion laceration to the left lateral knee but able to bear weight on that side no bony tenderness palpation.  No other associated injury or pain.     Physical Exam   Triage Vital Signs: ED Triage Vitals  Enc Vitals Group     BP 03/12/22 1301 120/87     Pulse Rate 03/12/22 1301 85     Resp 03/12/22 1301 17     Temp 03/12/22 1301 98.3 F (36.8 C)     Temp Source 03/12/22 1301 Oral     SpO2 03/12/22 1301 100 %     Weight 03/12/22 1302 290 lb (131.5 kg)     Height 03/12/22 1302 5\' 11"  (1.803 m)     Head Circumference --      Peak Flow --      Pain Score 03/12/22 1302 10     Pain Loc --      Pain Edu? --      Excl. in [place]? --     Most recent vital signs: Vitals:   03/12/22 1301  BP: 120/87  Pulse: 85  Resp: 17  Temp: 98.3 F (36.8 C)  SpO2: 100%     Constitutional: Alert  Eyes: Conjunctivae are normal.  Head: Atraumatic. Nose: No congestion/rhinnorhea. Mouth/Throat: Mucous membranes are moist.   Neck: Painless ROM.  Cardiovascular:   Good peripheral circulation. Respiratory: Normal respiratory effort.  No retractions.  Gastrointestinal: Soft and nontender.  Musculoskeletal: Swelling of the lateral malleolus.  Neurovascular intact distally.  Also with some mild pain to the right proximal lateral knee. Neurologic:  MAE spontaneously. No gross focal neurologic deficits are appreciated.  Skin:  Skin is warm, dry, 2 cm superficial linear laceration left lateral knee. Psychiatric: Mood and affect are normal. Speech and  behavior are normal.    ED Results / Procedures / Treatments   Labs (all labs ordered are listed, but only abnormal results are displayed) Labs Reviewed - No data to display   EKG     RADIOLOGY Please see ED Course for my review and interpretation.  I personally reviewed all radiographic images ordered to evaluate for the above acute complaints and reviewed radiology reports and findings.  These findings were personally discussed with the patient.  Please see medical record for radiology report.    PROCEDURES:  Critical Care performed: No  ..Laceration Repair  Date/Time: 03/12/2022 3:05 PM  Performed by: Merlyn Lot, MD Authorized by: Merlyn Lot, MD   Consent:    Consent obtained:  Verbal   Consent given by:  Patient Laceration details:    Location:  Leg   Leg location:  L knee   Length (cm):  2 Treatment:    Area cleansed with:  Chlorhexidine Skin repair:    Repair method:  Tissue adhesive Approximation:    Approximation:  Close Repair type:    Repair type:  Simple    MEDICATIONS ORDERED IN ED: Medications  oxyCODONE-acetaminophen (PERCOCET/ROXICET) 5-325 MG per tablet 1 tablet (  1 tablet Oral Given 03/12/22 1505)     IMPRESSION / MDM / ASSESSMENT AND PLAN / ED COURSE  I reviewed the triage vital signs and the nursing notes.                              Differential diagnosis includes, but is not limited to, fracture, contusion, laceration, dislocation  Patient presented to the ER for evaluation of right ankle pain right knee pain as well as laceration of the left knee as described above.  Will give pain medication and x-ray with evidence of avulsion fracture of the distal lateral malleolus.  Will place in boot for immobilization give crutches make nonweightbearing and discussed the need for outpatient follow-up.      Patient's presentation is most consistent with    FINAL CLINICAL IMPRESSION(S) / ED DIAGNOSES   Final diagnoses:   Sprain of right ankle, unspecified ligament, initial encounter  Closed avulsion fracture of lateral malleolus of right fibula, initial encounter     Rx / DC Orders   ED Discharge Orders          Ordered    oxyCODONE-acetaminophen (PERCOCET) 5-325 MG tablet  Every 4 hours PRN        03/12/22 1602             Note:  This document was prepared using Dragon voice recognition software and may include unintentional dictation errors.    Willy Eddy, MD 03/12/22 [phone]

## 2022-03-12 NOTE — ED Provider Triage Note (Signed)
Emergency Medicine Provider Triage Evaluation Note  Kristin Fuller , a 27 y.o. female  was evaluated in triage.  Pt complains of right foot and ankle pain after twist injury just prior to arrival.  Physical Exam  LMP 01/27/2022 (Approximate)  Gen:   Awake, no distress   Resp:  Normal effort  MSK:   Moves extremities without difficulty  Other:  PMS intact of the RLE  Medical Decision Making  Medically screening exam initiated at 12:57 PM.  Appropriate orders placed.  Allora Bains was informed that the remainder of the evaluation will be completed by another provider, this initial triage assessment does not replace that evaluation, and the importance of remaining in the ED until their evaluation is complete.  Imaging ordered.   Chinita Pester, FNP 03/12/22 1300

## 2022-03-12 NOTE — ED Triage Notes (Signed)
Pt to ED POV for R ankle pain after was walking, slipped, twisted ankle, and heard a "pop". Outer ankle swollen. NP examined pt in triage.

## 2023-01-21 ENCOUNTER — Emergency Department: Payer: Medicaid Other

## 2023-01-21 ENCOUNTER — Other Ambulatory Visit: Payer: Self-pay

## 2023-01-21 ENCOUNTER — Emergency Department
Admission: EM | Admit: 2023-01-21 | Discharge: 2023-01-21 | Disposition: A | Discharge status: Home / Self Care | Payer: Medicaid Other | Attending: Emergency Medicine | Admitting: Emergency Medicine

## 2023-01-21 DIAGNOSIS — S6991XA Unspecified injury of right wrist, hand and finger(s), initial encounter: Secondary | ICD-10-CM | POA: Diagnosis present

## 2023-01-21 DIAGNOSIS — W228XXA Striking against or struck by other objects, initial encounter: Secondary | ICD-10-CM | POA: Insufficient documentation

## 2023-01-21 DIAGNOSIS — S60221A Contusion of right hand, initial encounter: Secondary | ICD-10-CM | POA: Insufficient documentation

## 2023-01-21 MED ORDER — DICLOFENAC SODIUM 1 % EX GEL: 2.0000 g | Freq: Four times a day (QID) | CUTANEOUS | 1 refills | Status: AC

## 2023-01-21 NOTE — Discharge Instructions (Addendum)
You can take Tylenol and ibuprofen as needed.  Please use the Voltaren gel as prescribed.  Reach out to Dr. Joice Lofts who is an orthopedic provider and schedule an appointment.  He can do further workup of your hand.

## 2023-01-21 NOTE — ED Triage Notes (Signed)
Patient hit right hand on doorknob with key 1.5 months ago; states hand still hurts to grip.

## 2023-01-21 NOTE — ED Provider Notes (Signed)
[facility] Provider Note    Event Date/Time   First MD Initiated Contact with Patient 01/21/23 1944     (approximate)   History   Hand Injury    Kristin Fuller is a 27 y.o. female with no PMH presents for evaluation of ongoing right hand pain.  Patient states that a month and a half ago she hit the back of her hand on a piece sticking out of a doorknob.  She states that it broke her skin and was swollen for a couple days after that.  She is here today because she is still having pain.  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     Physical Exam   Triage Vital Signs: ED Triage Vitals  Encounter Vitals Group     BP 01/21/23 1705 (!) 134/100     Systolic BP Percentile --      Diastolic BP Percentile --      Pulse Rate 01/21/23 1705 92     Resp 01/21/23 1703 18     Temp 01/21/23 1703 98.2 F (36.8 C)     Temp Source 01/21/23 1703 Oral     SpO2 01/21/23 1703 100 %     Weight 01/21/23 1703 285 lb (129.3 kg)     Height 01/21/23 1703 5\' 11"  (1.803 m)     Head Circumference --      Peak Flow --      Pain Score 01/21/23 1702 8     Pain Loc --      Pain Education --      Exclude from Growth Chart --     Most recent vital signs: Vitals:   01/21/23 1705 01/21/23 1705  BP: (!) 134/100   Pulse:  92  Resp:    Temp:    SpO2:      General: Awake, no distress.  CV:  Good peripheral perfusion.  Resp:  Normal effort.  Abd:  No distention.  Other:  No swelling of the right hand when compared to the left, no overlying skin changes or ecchymosis.  The patient is tender to palpation between the heads of the second and third metacarpals on the dorsal side of the hand.  There is a very small amount of localized swelling here.  Grip strength in the right hand is decreased when compared to the left, radial pulses 2+ and regular, sensation is intact across all  dermatomes.   ED Results / Procedures / Treatments   Labs (all labs ordered are listed, but only abnormal results are displayed) Labs Reviewed - No data to display   RADIOLOGY  Right hand x-rays obtained, I interpreted the images as well as reviewed the radiologist report which is negative for any acute abnormalities.   PROCEDURES:  Critical Care performed: No  Procedures   MEDICATIONS ORDERED IN ED: Medications - No data to display   IMPRESSION / MDM / ASSESSMENT AND PLAN / ED COURSE  I reviewed the triage vital signs and the nursing notes.                             27 year old female presents for evaluation of ongoing right hand pain after an injury a month and a half ago.  Patient was mildly hypertensive otherwise vital signs are stable.  Patient  NAD on exam.  Differential diagnosis includes, but is not limited to, fracture, contusion, cyst, bone bruise.  Patient's presentation is most consistent with acute complicated illness / injury requiring diagnostic workup.  Right hand x-rays obtained, interpreted the images as well as reviewed the radiologist report which is negative for any acute abnormalities.  I am not sure what is causing patient's pain especially given that this injury happened a month and a half ago.  Could be ongoing bruising due to continued use of the hand.  I recommended that patient try Voltaren gel and I will get her a referral to a hand specialist.  She did not want a note for work.  She voiced understanding, all questions were answered and she was stable at discharge.     FINAL CLINICAL IMPRESSION(S) / ED DIAGNOSES   Final diagnoses:  Contusion of right hand, initial encounter     Rx / DC Orders   ED Discharge Orders          Ordered    diclofenac Sodium (VOLTAREN) 1 % GEL  4 times daily        01/21/23 2129             Note:  This document was prepared using Dragon voice recognition software and may include unintentional  dictation errors.   Cameron Ali, PA-C 01/21/23 2132    Merwyn Katos, MD 01/21/23 2216

## 2023-01-22 ENCOUNTER — Ambulatory Visit: Payer: Self-pay
